# Patient Record
Sex: Female | Born: 1975 | Race: White | Hispanic: No | Marital: Married | Smoking: Current every day smoker
Health system: Southern US, Community
[De-identification: ages and names within clinical notes are randomized; demographics above are authoritative.]

## PROBLEM LIST (undated history)

## (undated) DIAGNOSIS — F3112 Bipolar disorder, current episode manic without psychotic features, moderate: Secondary | ICD-10-CM

## (undated) DIAGNOSIS — IMO0002 Reserved for concepts with insufficient information to code with codable children: Secondary | ICD-10-CM

## (undated) HISTORY — PX: BACK SURGERY: SHX140

## (undated) HISTORY — PX: CHOLECYSTECTOMY: SHX55

---

## 2006-10-29 HISTORY — PX: SPINAL FUSION: SHX223

## 2007-08-10 ENCOUNTER — Emergency Department (HOSPITAL_COMMUNITY): Admission: EM | Admit: 2007-08-10 | Discharge: 2007-08-10 | Payer: Self-pay | Admitting: Emergency Medicine

## 2008-02-11 ENCOUNTER — Emergency Department (HOSPITAL_COMMUNITY): Admission: EM | Admit: 2008-02-11 | Discharge: 2008-02-11 | Payer: Self-pay | Admitting: Emergency Medicine

## 2008-02-15 ENCOUNTER — Emergency Department (HOSPITAL_COMMUNITY): Admission: EM | Admit: 2008-02-15 | Discharge: 2008-02-16 | Payer: Self-pay | Admitting: Emergency Medicine

## 2009-02-02 ENCOUNTER — Emergency Department (HOSPITAL_COMMUNITY): Admission: EM | Admit: 2009-02-02 | Discharge: 2009-02-02 | Payer: Self-pay | Admitting: Emergency Medicine

## 2009-03-18 ENCOUNTER — Emergency Department: Payer: Self-pay | Admitting: Emergency Medicine

## 2009-04-29 ENCOUNTER — Emergency Department: Payer: Self-pay | Admitting: Emergency Medicine

## 2009-05-12 ENCOUNTER — Emergency Department: Payer: Self-pay | Admitting: Emergency Medicine

## 2009-06-23 ENCOUNTER — Emergency Department: Payer: Self-pay | Admitting: Emergency Medicine

## 2009-07-16 ENCOUNTER — Emergency Department: Payer: Self-pay | Admitting: Emergency Medicine

## 2009-07-28 ENCOUNTER — Emergency Department: Payer: Self-pay | Admitting: Internal Medicine

## 2009-09-15 ENCOUNTER — Emergency Department (HOSPITAL_COMMUNITY): Admission: EM | Admit: 2009-09-15 | Discharge: 2009-09-15 | Payer: Self-pay | Admitting: Emergency Medicine

## 2009-09-21 ENCOUNTER — Emergency Department (HOSPITAL_COMMUNITY): Admission: EM | Admit: 2009-09-21 | Discharge: 2009-09-21 | Payer: Self-pay | Admitting: Emergency Medicine

## 2009-10-29 HISTORY — PX: LUMBAR DISC SURGERY: SHX700

## 2010-02-04 ENCOUNTER — Emergency Department (HOSPITAL_COMMUNITY): Admission: EM | Admit: 2010-02-04 | Discharge: 2010-02-04 | Payer: Self-pay | Admitting: Emergency Medicine

## 2010-06-08 ENCOUNTER — Emergency Department (HOSPITAL_COMMUNITY): Admission: EM | Admit: 2010-06-08 | Discharge: 2010-06-08 | Payer: Self-pay | Admitting: Emergency Medicine

## 2010-06-19 ENCOUNTER — Emergency Department (HOSPITAL_COMMUNITY): Admission: EM | Admit: 2010-06-19 | Discharge: 2010-06-19 | Payer: Self-pay | Admitting: Emergency Medicine

## 2010-06-30 ENCOUNTER — Emergency Department (HOSPITAL_COMMUNITY): Admission: EM | Admit: 2010-06-30 | Discharge: 2010-06-30 | Payer: Self-pay | Admitting: Emergency Medicine

## 2010-07-04 ENCOUNTER — Emergency Department (HOSPITAL_COMMUNITY): Admission: EM | Admit: 2010-07-04 | Discharge: 2010-07-04 | Payer: Self-pay | Admitting: Emergency Medicine

## 2010-07-13 ENCOUNTER — Emergency Department (HOSPITAL_COMMUNITY): Admission: EM | Admit: 2010-07-13 | Discharge: 2010-07-13 | Payer: Self-pay | Admitting: Emergency Medicine

## 2010-07-25 ENCOUNTER — Emergency Department (HOSPITAL_COMMUNITY): Admission: EM | Admit: 2010-07-25 | Discharge: 2010-07-25 | Payer: Self-pay | Admitting: Emergency Medicine

## 2010-08-03 ENCOUNTER — Emergency Department (HOSPITAL_COMMUNITY): Admission: EM | Admit: 2010-08-03 | Discharge: 2010-08-03 | Payer: Self-pay | Admitting: Emergency Medicine

## 2010-08-06 ENCOUNTER — Emergency Department (HOSPITAL_COMMUNITY): Admission: EM | Admit: 2010-08-06 | Discharge: 2010-08-06 | Payer: Self-pay | Admitting: Emergency Medicine

## 2010-08-08 ENCOUNTER — Inpatient Hospital Stay (HOSPITAL_COMMUNITY): Admission: RE | Admit: 2010-08-08 | Discharge: 2010-08-11 | Payer: Self-pay | Admitting: Emergency Medicine

## 2010-08-10 ENCOUNTER — Encounter: Payer: Self-pay | Admitting: Neurosurgery

## 2010-10-05 ENCOUNTER — Emergency Department (HOSPITAL_COMMUNITY): Admission: EM | Admit: 2010-10-05 | Discharge: 2010-04-01 | Payer: Self-pay | Admitting: Emergency Medicine

## 2010-12-22 ENCOUNTER — Emergency Department (HOSPITAL_COMMUNITY)
Admission: EM | Admit: 2010-12-22 | Discharge: 2010-12-22 | Disposition: A | Discharge status: Home / Self Care | Payer: Self-pay | Attending: Emergency Medicine | Admitting: Emergency Medicine

## 2010-12-22 DIAGNOSIS — M5430 Sciatica, unspecified side: Secondary | ICD-10-CM | POA: Insufficient documentation

## 2010-12-22 DIAGNOSIS — M545 Low back pain, unspecified: Secondary | ICD-10-CM | POA: Insufficient documentation

## 2010-12-22 DIAGNOSIS — F329 Major depressive disorder, single episode, unspecified: Secondary | ICD-10-CM | POA: Insufficient documentation

## 2010-12-22 DIAGNOSIS — G8929 Other chronic pain: Secondary | ICD-10-CM | POA: Insufficient documentation

## 2010-12-22 DIAGNOSIS — I10 Essential (primary) hypertension: Secondary | ICD-10-CM | POA: Insufficient documentation

## 2010-12-22 DIAGNOSIS — E669 Obesity, unspecified: Secondary | ICD-10-CM | POA: Insufficient documentation

## 2010-12-22 DIAGNOSIS — F3289 Other specified depressive episodes: Secondary | ICD-10-CM | POA: Insufficient documentation

## 2010-12-22 DIAGNOSIS — IMO0002 Reserved for concepts with insufficient information to code with codable children: Secondary | ICD-10-CM | POA: Insufficient documentation

## 2010-12-22 LAB — URINALYSIS, ROUTINE W REFLEX MICROSCOPIC
Nitrite: NEGATIVE
Protein, ur: NEGATIVE mg/dL
Specific Gravity, Urine: 1.024 (ref 1.005–1.030)
Urobilinogen, UA: 0.2 mg/dL (ref 0.0–1.0)

## 2010-12-25 LAB — POCT PREGNANCY, URINE: Preg Test, Ur: NEGATIVE

## 2011-01-11 LAB — DIFFERENTIAL
Basophils Absolute: 0 10*3/uL (ref 0.0–0.1)
Eosinophils Relative: 3 % (ref 0–5)
Lymphocytes Relative: 26 % (ref 12–46)
Lymphocytes Relative: 32 % (ref 12–46)
Lymphs Abs: 2.5 10*3/uL (ref 0.7–4.0)
Neutro Abs: 4.6 10*3/uL (ref 1.7–7.7)
Neutrophils Relative %: 57 % (ref 43–77)
Neutrophils Relative %: 66 % (ref 43–77)

## 2011-01-11 LAB — CBC
MCH: 31.1 pg (ref 26.0–34.0)
MCV: 91.7 fL (ref 78.0–100.0)
MCV: 91.9 fL (ref 78.0–100.0)
MCV: 93.5 fL (ref 78.0–100.0)
Platelets: 181 10*3/uL (ref 150–400)
Platelets: 185 10*3/uL (ref 150–400)
Platelets: 226 10*3/uL (ref 150–400)
RBC: 3.82 MIL/uL — ABNORMAL LOW (ref 3.87–5.11)
RBC: 4.38 MIL/uL (ref 3.87–5.11)
RDW: 13.8 % (ref 11.5–15.5)
RDW: 14.8 % (ref 11.5–15.5)
WBC: 8.1 10*3/uL (ref 4.0–10.5)
WBC: 9.7 10*3/uL (ref 4.0–10.5)

## 2011-01-11 LAB — URINALYSIS, ROUTINE W REFLEX MICROSCOPIC
Glucose, UA: NEGATIVE mg/dL
pH: 6.5 (ref 5.0–8.0)

## 2011-01-11 LAB — BASIC METABOLIC PANEL
BUN: 3 mg/dL — ABNORMAL LOW (ref 6–23)
Calcium: 8.3 mg/dL — ABNORMAL LOW (ref 8.4–10.5)
Calcium: 8.8 mg/dL (ref 8.4–10.5)
Chloride: 105 mEq/L (ref 96–112)
Creatinine, Ser: 0.74 mg/dL (ref 0.4–1.2)
Creatinine, Ser: 0.76 mg/dL (ref 0.4–1.2)
GFR calc Af Amer: >60 mL/min (ref >60)
GFR calc Af Amer: >60 mL/min (ref >60)
GFR calc non Af Amer: >60 mL/min (ref >60)
Sodium: 137 mEq/L (ref 135–145)

## 2011-01-11 LAB — COMPREHENSIVE METABOLIC PANEL
Albumin: 3.5 g/dL (ref 3.5–5.2)
BUN: 6 mg/dL (ref 6–23)
Chloride: 107 mEq/L (ref 96–112)
Creatinine, Ser: 0.68 mg/dL (ref 0.4–1.2)
GFR calc non Af Amer: >60 mL/min (ref >60)
Total Bilirubin: 0.7 mg/dL (ref 0.3–1.2)

## 2011-01-11 LAB — URINE CULTURE
Colony Count: 55000
Culture  Setup Time: 201110120537

## 2011-01-11 LAB — APTT: aPTT: 31 seconds (ref 24–37)

## 2011-01-11 LAB — PROTIME-INR
INR: 0.98 (ref 0.00–1.49)
INR: 1.15 (ref 0.00–1.49)
Prothrombin Time: 13.2 seconds (ref 11.6–15.2)
Prothrombin Time: 14.9 seconds (ref 11.6–15.2)

## 2011-01-11 LAB — URINE MICROSCOPIC-ADD ON

## 2011-01-11 LAB — RAPID URINE DRUG SCREEN, HOSP PERFORMED: Tetrahydrocannabinol: POSITIVE — AB

## 2011-01-17 LAB — POCT I-STAT, CHEM 8
Chloride: 106 mEq/L (ref 96–112)
Creatinine, Ser: 0.7 mg/dL (ref 0.4–1.2)
Glucose, Bld: 87 mg/dL (ref 70–99)
Potassium: 4 mEq/L (ref 3.5–5.1)

## 2011-01-17 LAB — URINALYSIS, ROUTINE W REFLEX MICROSCOPIC
Bilirubin Urine: NEGATIVE
Hgb urine dipstick: NEGATIVE
Ketones, ur: NEGATIVE mg/dL
Protein, ur: NEGATIVE mg/dL
Urobilinogen, UA: 0.2 mg/dL (ref 0.0–1.0)

## 2011-01-31 LAB — POCT PREGNANCY, URINE: Preg Test, Ur: NEGATIVE

## 2011-01-31 LAB — URINALYSIS, ROUTINE W REFLEX MICROSCOPIC
Bilirubin Urine: NEGATIVE
Hgb urine dipstick: NEGATIVE
Nitrite: NEGATIVE
Nitrite: NEGATIVE
Protein, ur: 100 mg/dL — AB
Specific Gravity, Urine: 1.027 (ref 1.005–1.030)
Specific Gravity, Urine: >1.03 — ABNORMAL HIGH (ref 1.005–1.030)
Urobilinogen, UA: 0.2 mg/dL (ref 0.0–1.0)
pH: 6 (ref 5.0–8.0)

## 2011-01-31 LAB — POCT I-STAT, CHEM 8
BUN: 10 mg/dL (ref 6–23)
Calcium, Ion: 1.19 mmol/L (ref 1.12–1.32)
Creatinine, Ser: 0.7 mg/dL (ref 0.4–1.2)
Glucose, Bld: 83 mg/dL (ref 70–99)
Hemoglobin: 12.9 g/dL (ref 12.0–15.0)
Sodium: 143 mEq/L (ref 135–145)
TCO2: 26 mmol/L (ref 0–100)

## 2011-01-31 LAB — URINE MICROSCOPIC-ADD ON

## 2011-01-31 LAB — RAPID URINE DRUG SCREEN, HOSP PERFORMED
Amphetamines: NOT DETECTED
Benzodiazepines: NOT DETECTED
Cocaine: NOT DETECTED

## 2011-01-31 LAB — CBC
HCT: 36.5 % (ref 36.0–46.0)
Hemoglobin: 12.6 g/dL (ref 12.0–15.0)
MCV: 93 fL (ref 78.0–100.0)
Platelets: 174 10*3/uL (ref 150–400)
WBC: 6.8 10*3/uL (ref 4.0–10.5)

## 2011-01-31 LAB — WET PREP, GENITAL
Clue Cells Wet Prep HPF POC: NONE SEEN
Trich, Wet Prep: NONE SEEN

## 2011-01-31 LAB — DIFFERENTIAL
Eosinophils Absolute: 0.2 10*3/uL (ref 0.0–0.7)
Eosinophils Relative: 3 % (ref 0–5)
Lymphocytes Relative: 33 % (ref 12–46)
Lymphs Abs: 2.3 10*3/uL (ref 0.7–4.0)
Monocytes Absolute: 0.4 10*3/uL (ref 0.1–1.0)
Monocytes Relative: 6 % (ref 3–12)

## 2011-01-31 LAB — URINE CULTURE

## 2011-02-13 ENCOUNTER — Emergency Department (HOSPITAL_COMMUNITY)
Admission: EM | Admit: 2011-02-13 | Discharge: 2011-02-14 | Disposition: A | Discharge status: Home / Self Care | Payer: Self-pay | Attending: Emergency Medicine | Admitting: Emergency Medicine

## 2011-02-13 DIAGNOSIS — R112 Nausea with vomiting, unspecified: Secondary | ICD-10-CM | POA: Insufficient documentation

## 2011-02-13 DIAGNOSIS — A499 Bacterial infection, unspecified: Secondary | ICD-10-CM | POA: Insufficient documentation

## 2011-02-13 DIAGNOSIS — N949 Unspecified condition associated with female genital organs and menstrual cycle: Secondary | ICD-10-CM | POA: Insufficient documentation

## 2011-02-13 DIAGNOSIS — B9689 Other specified bacterial agents as the cause of diseases classified elsewhere: Secondary | ICD-10-CM | POA: Insufficient documentation

## 2011-02-13 DIAGNOSIS — E860 Dehydration: Secondary | ICD-10-CM | POA: Insufficient documentation

## 2011-02-13 DIAGNOSIS — R197 Diarrhea, unspecified: Secondary | ICD-10-CM | POA: Insufficient documentation

## 2011-02-13 DIAGNOSIS — F329 Major depressive disorder, single episode, unspecified: Secondary | ICD-10-CM | POA: Insufficient documentation

## 2011-02-13 DIAGNOSIS — F3289 Other specified depressive episodes: Secondary | ICD-10-CM | POA: Insufficient documentation

## 2011-02-13 DIAGNOSIS — G8929 Other chronic pain: Secondary | ICD-10-CM | POA: Insufficient documentation

## 2011-02-13 DIAGNOSIS — M549 Dorsalgia, unspecified: Secondary | ICD-10-CM | POA: Insufficient documentation

## 2011-02-13 DIAGNOSIS — N760 Acute vaginitis: Secondary | ICD-10-CM | POA: Insufficient documentation

## 2011-02-13 DIAGNOSIS — R1031 Right lower quadrant pain: Secondary | ICD-10-CM | POA: Insufficient documentation

## 2011-02-14 ENCOUNTER — Emergency Department (HOSPITAL_COMMUNITY): Payer: Self-pay

## 2011-02-14 LAB — WET PREP, GENITAL: Trich, Wet Prep: NONE SEEN

## 2011-02-14 LAB — URINALYSIS, ROUTINE W REFLEX MICROSCOPIC
Hgb urine dipstick: NEGATIVE
Ketones, ur: NEGATIVE mg/dL
Nitrite: NEGATIVE
Specific Gravity, Urine: >1.03 — ABNORMAL HIGH (ref 1.005–1.030)
pH: 5.5 (ref 5.0–8.0)

## 2011-02-14 LAB — DIFFERENTIAL
Basophils Absolute: 0 10*3/uL (ref 0.0–0.1)
Basophils Relative: 0 % (ref 0–1)
Lymphocytes Relative: 36 % (ref 12–46)
Neutro Abs: 4.6 10*3/uL (ref 1.7–7.7)
Neutrophils Relative %: 55 % (ref 43–77)

## 2011-02-14 LAB — HEPATIC FUNCTION PANEL
AST: 16 U/L (ref 0–37)
Albumin: 3.3 g/dL — ABNORMAL LOW (ref 3.5–5.2)
Alkaline Phosphatase: 66 U/L (ref 39–117)
Total Bilirubin: 0.7 mg/dL (ref 0.3–1.2)

## 2011-02-14 LAB — CBC
HCT: 35.8 % — ABNORMAL LOW (ref 36.0–46.0)
Hemoglobin: 12 g/dL (ref 12.0–15.0)
RBC: 4.06 MIL/uL (ref 3.87–5.11)
WBC: 8.5 10*3/uL (ref 4.0–10.5)

## 2011-02-14 LAB — BASIC METABOLIC PANEL
CO2: 25 mEq/L (ref 19–32)
Calcium: 8.4 mg/dL (ref 8.4–10.5)
Chloride: 109 mEq/L (ref 96–112)
GFR calc Af Amer: >60 mL/min (ref >60)
Sodium: 139 mEq/L (ref 135–145)

## 2011-02-14 LAB — PREGNANCY, URINE: Preg Test, Ur: NEGATIVE

## 2011-02-14 MED ORDER — IOHEXOL 300 MG/ML  SOLN: 100.0000 mL | Freq: Once | INTRAMUSCULAR | Status: DC | PRN

## 2011-02-15 LAB — GC/CHLAMYDIA PROBE AMP, GENITAL: GC Probe Amp, Genital: POSITIVE — AB

## 2011-03-07 ENCOUNTER — Emergency Department (HOSPITAL_COMMUNITY)
Admission: EM | Admit: 2011-03-07 | Discharge: 2011-03-07 | Disposition: A | Discharge status: Home / Self Care | Payer: Self-pay | Attending: Emergency Medicine | Admitting: Emergency Medicine

## 2011-03-07 DIAGNOSIS — M549 Dorsalgia, unspecified: Secondary | ICD-10-CM | POA: Insufficient documentation

## 2011-03-25 ENCOUNTER — Observation Stay (HOSPITAL_COMMUNITY)
Admission: EM | Admit: 2011-03-25 | Discharge: 2011-03-25 | Disposition: A | Discharge status: Home / Self Care | Payer: Self-pay | Attending: Emergency Medicine | Admitting: Emergency Medicine

## 2011-03-25 DIAGNOSIS — M79609 Pain in unspecified limb: Secondary | ICD-10-CM | POA: Insufficient documentation

## 2011-03-25 DIAGNOSIS — M545 Low back pain, unspecified: Principal | ICD-10-CM | POA: Insufficient documentation

## 2011-03-25 DIAGNOSIS — G8929 Other chronic pain: Secondary | ICD-10-CM | POA: Insufficient documentation

## 2011-03-25 DIAGNOSIS — N390 Urinary tract infection, site not specified: Secondary | ICD-10-CM | POA: Insufficient documentation

## 2011-03-25 LAB — URINALYSIS, ROUTINE W REFLEX MICROSCOPIC
Glucose, UA: NEGATIVE mg/dL
Nitrite: NEGATIVE
Protein, ur: NEGATIVE mg/dL
Urobilinogen, UA: 0.2 mg/dL (ref 0.0–1.0)

## 2011-03-25 LAB — URINE MICROSCOPIC-ADD ON

## 2011-03-27 LAB — URINE CULTURE: Colony Count: >=100000

## 2011-05-02 ENCOUNTER — Emergency Department (HOSPITAL_COMMUNITY): Payer: Self-pay

## 2011-05-02 ENCOUNTER — Emergency Department (HOSPITAL_COMMUNITY)
Admission: EM | Admit: 2011-05-02 | Discharge: 2011-05-02 | Disposition: A | Discharge status: Home / Self Care | Payer: Self-pay | Attending: Emergency Medicine | Admitting: Emergency Medicine

## 2011-05-02 DIAGNOSIS — M25579 Pain in unspecified ankle and joints of unspecified foot: Secondary | ICD-10-CM | POA: Insufficient documentation

## 2011-05-02 DIAGNOSIS — Y9301 Activity, walking, marching and hiking: Secondary | ICD-10-CM | POA: Insufficient documentation

## 2011-05-02 DIAGNOSIS — S7000XA Contusion of unspecified hip, initial encounter: Secondary | ICD-10-CM | POA: Insufficient documentation

## 2011-05-02 DIAGNOSIS — Y92410 Unspecified street and highway as the place of occurrence of the external cause: Secondary | ICD-10-CM | POA: Insufficient documentation

## 2011-05-02 DIAGNOSIS — W010XXA Fall on same level from slipping, tripping and stumbling without subsequent striking against object, initial encounter: Secondary | ICD-10-CM | POA: Insufficient documentation

## 2011-05-02 DIAGNOSIS — M25559 Pain in unspecified hip: Secondary | ICD-10-CM | POA: Insufficient documentation

## 2011-05-16 ENCOUNTER — Emergency Department (HOSPITAL_COMMUNITY)
Admission: EM | Admit: 2011-05-16 | Discharge: 2011-05-17 | Disposition: A | Discharge status: Home / Self Care | Payer: Self-pay | Attending: Emergency Medicine | Admitting: Emergency Medicine

## 2011-05-16 DIAGNOSIS — M545 Low back pain, unspecified: Secondary | ICD-10-CM | POA: Insufficient documentation

## 2011-05-16 DIAGNOSIS — G8929 Other chronic pain: Secondary | ICD-10-CM | POA: Insufficient documentation

## 2011-05-16 DIAGNOSIS — F329 Major depressive disorder, single episode, unspecified: Secondary | ICD-10-CM | POA: Insufficient documentation

## 2011-05-16 DIAGNOSIS — F3289 Other specified depressive episodes: Secondary | ICD-10-CM | POA: Insufficient documentation

## 2011-05-25 IMAGING — CR DG LUMBAR SPINE COMPLETE 4+V
5 series · 5 of 5 positions shown · non-contrast
Comparison: None.

CLINICAL DATA: 34-year-old female with right hip pain status post
fall.

LUMBAR SPINE - COMPLETE 4+ VIEW

[t l-spine a.p.]
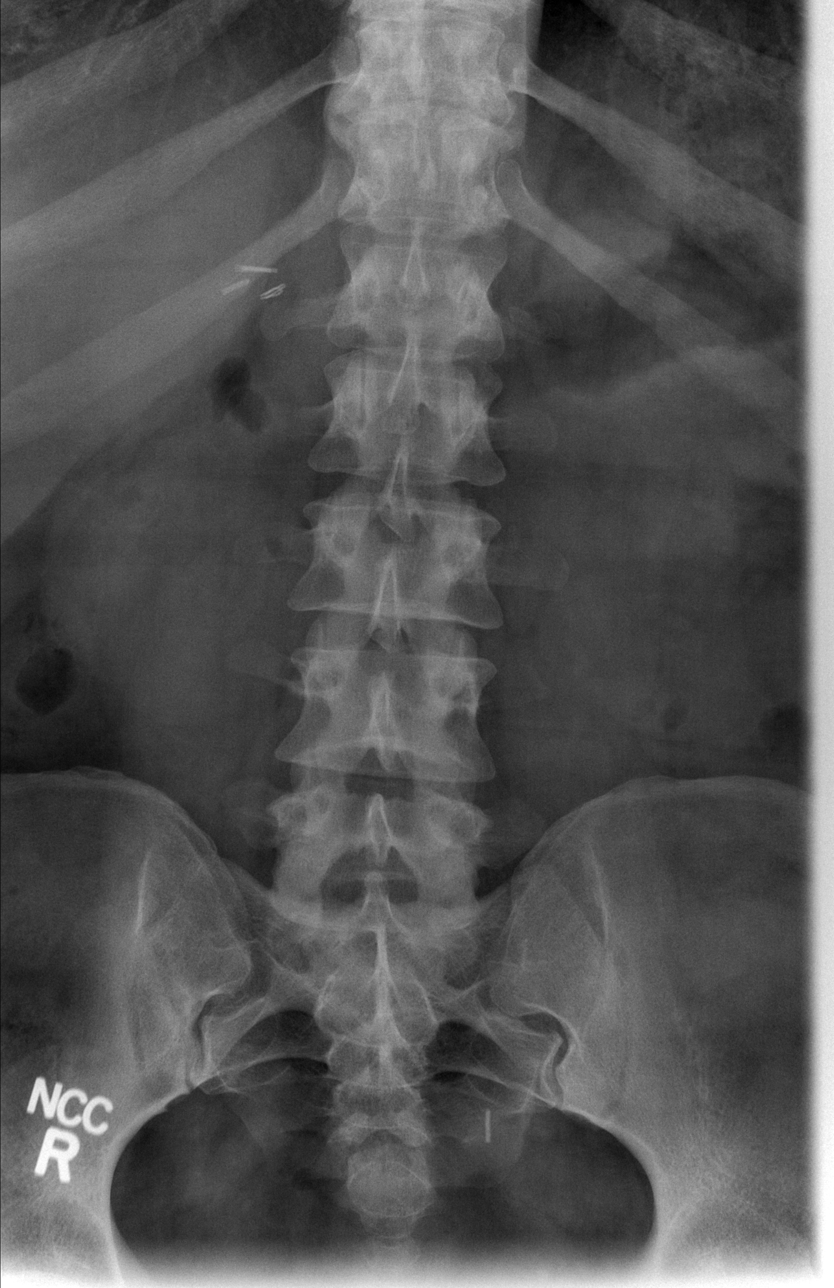

[t l-spine oblique exposure (1 of 2)]
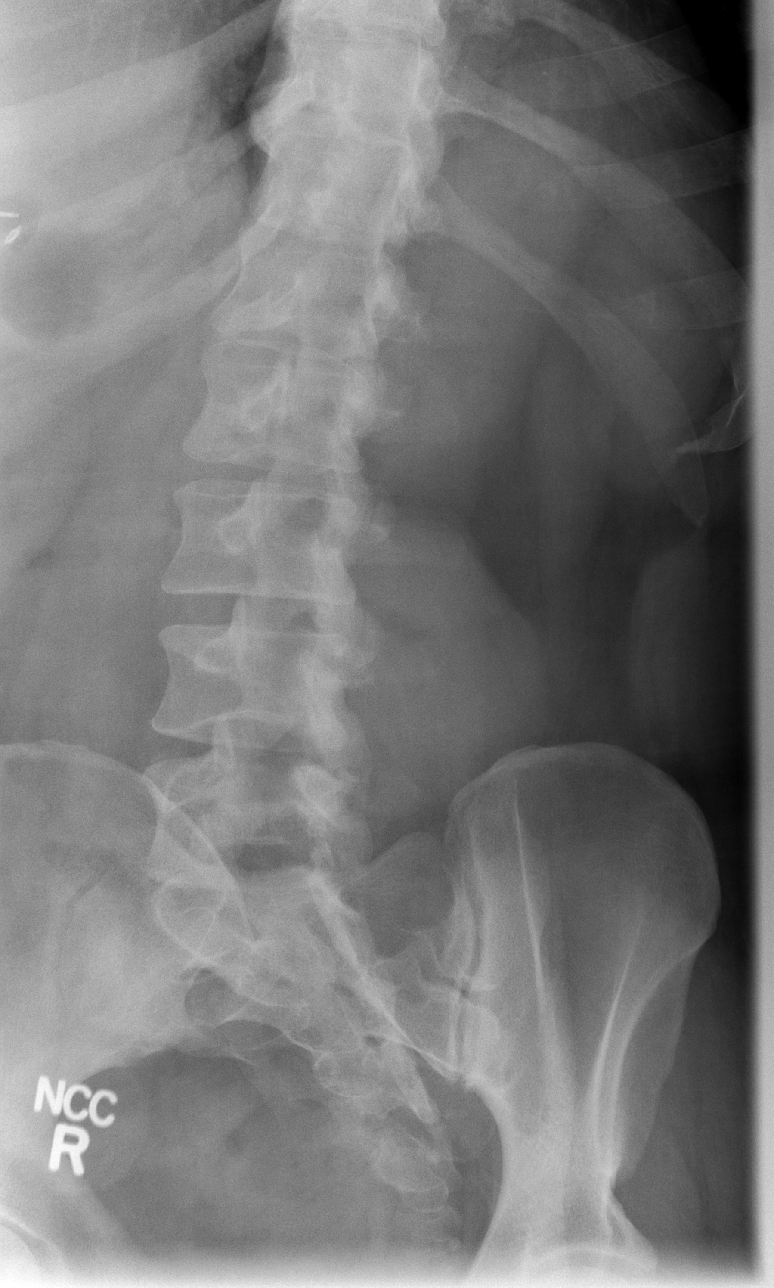

[t l-spine oblique exposure (2 of 2)]
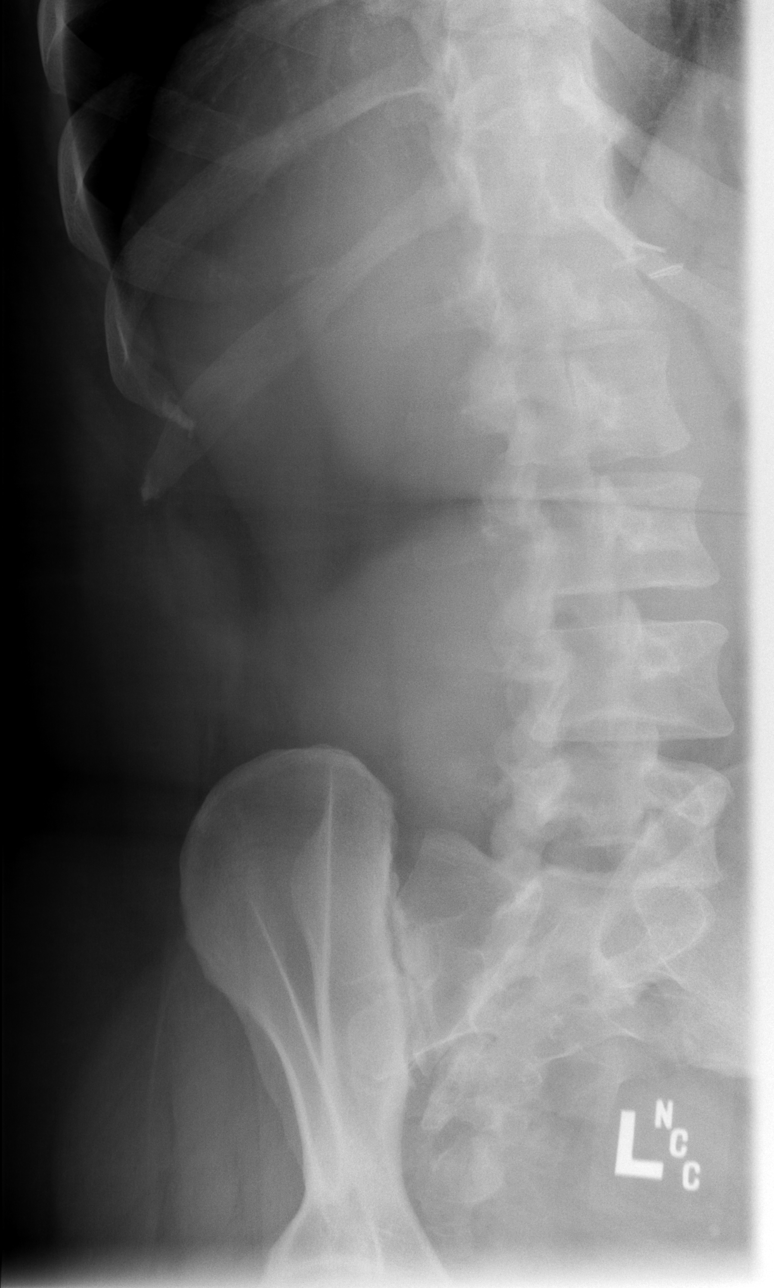

[t l-spine lat]
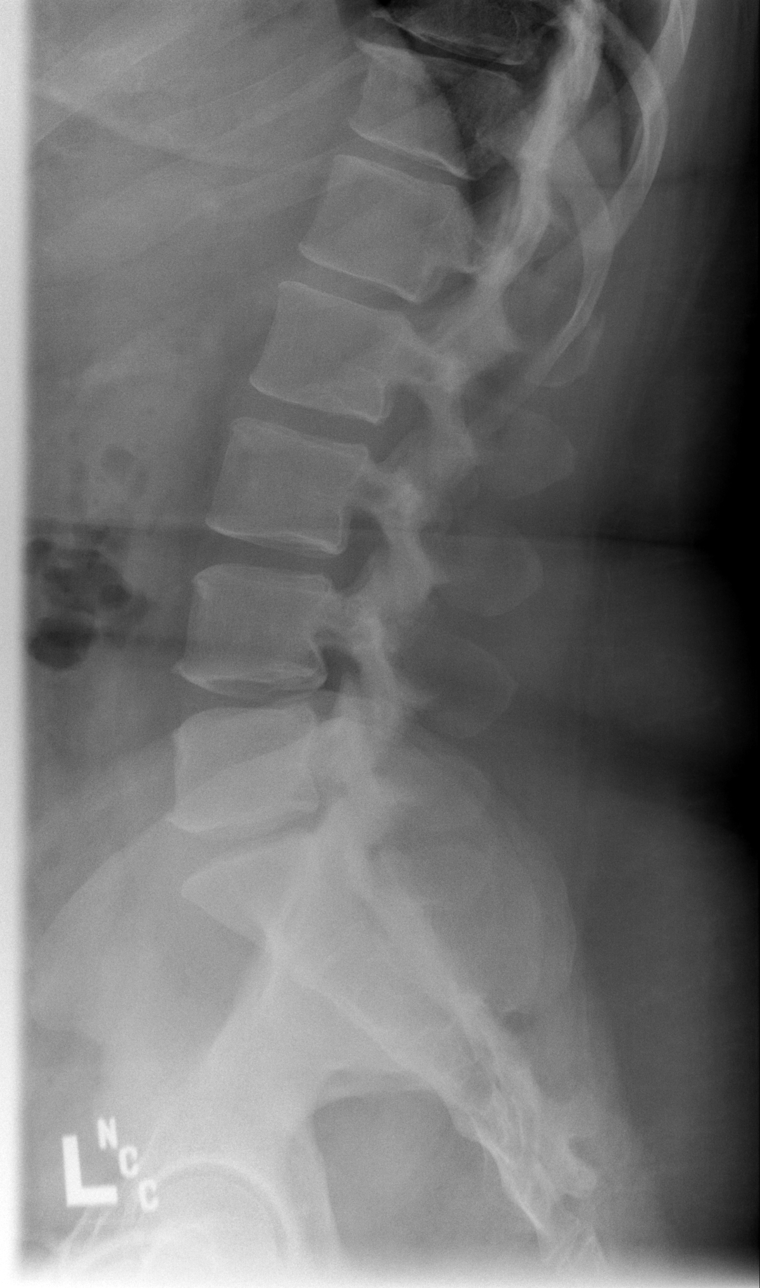

[t l-spine l5-s1 spot]
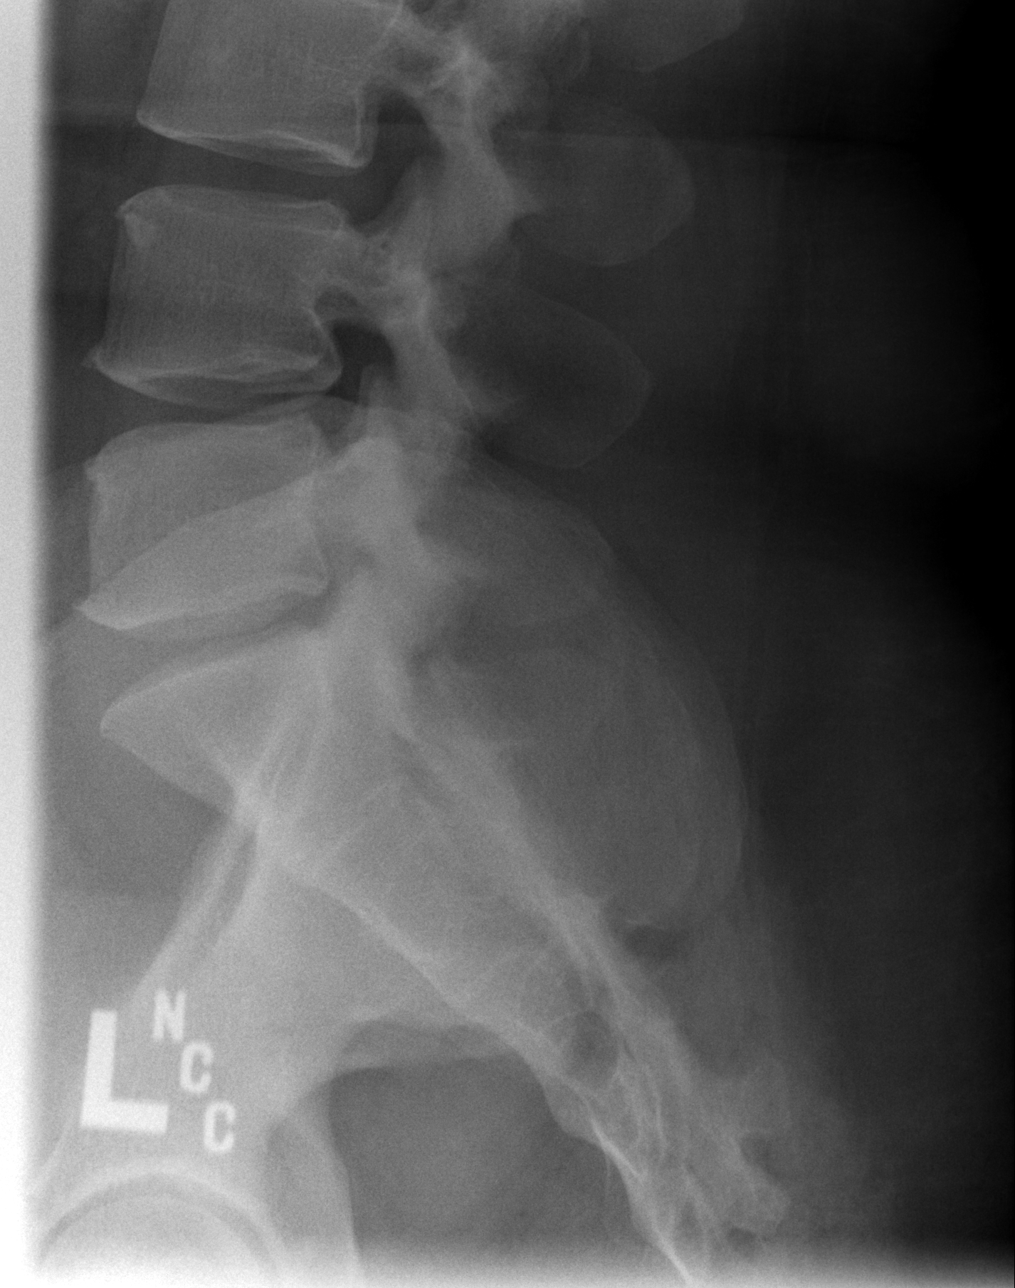

[5 of 5 positions shown; findings below may reference images not displayed]

FINDINGS: Right upper quadrant surgical clips.  Normal lumbar
segmentation. Bone mineralization is within normal limits. Normal
vertebral body height alignment.  Mild L4-L5 and L5-L1 disc space
narrowing with early endplate spurring.  Anterior endplate spurring
in the lower thoracic spine.  Probable congenital ununited left L1
transverse process ossification center.  Sacrum and SI joints are
within normal limits.  Left hemi pelvis surgical clip.  No pars
fracture.
IMPRESSION: No acute fracture or listhesis identified in the lumbar spine.

## 2011-05-25 IMAGING — CR DG PELVIS 1-2V
1 series · 1 of 1 positions shown · non-contrast
Comparison: None

CLINICAL DATA: Right hip pain.

PELVIS - 1-2 VIEW

[t pelvis a.p.]
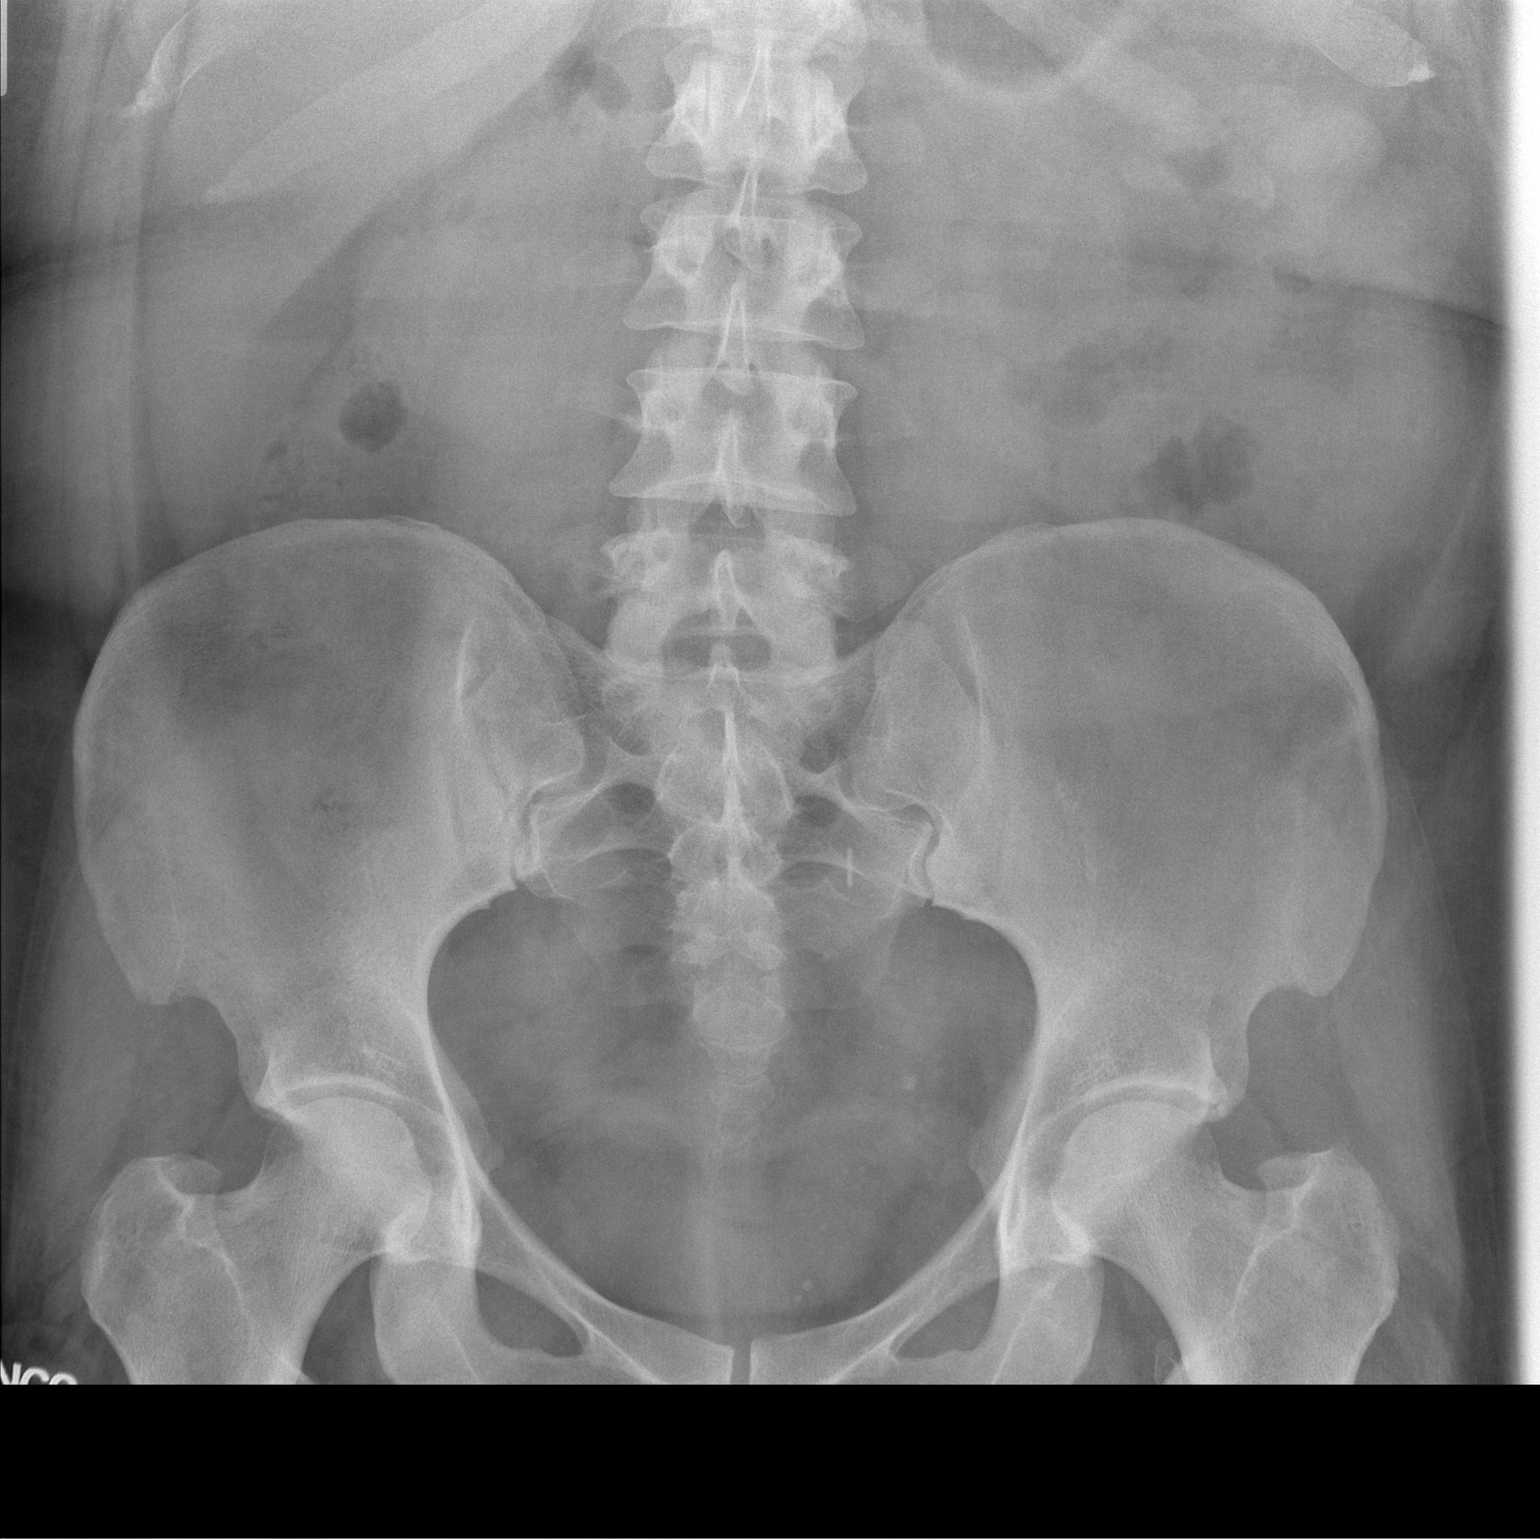

[1 of 1 positions shown; findings below may reference images not displayed]

FINDINGS: No acute bony abnormality.  Specifically, no fracture,
subluxation, or dislocation.  Soft tissues are intact. SI joints
are unremarkable.
IMPRESSION: No acute bony abnormality.

## 2011-07-24 LAB — RAPID URINE DRUG SCREEN, HOSP PERFORMED
Barbiturates: NOT DETECTED
Opiates: NOT DETECTED

## 2011-07-24 LAB — POCT I-STAT, CHEM 8
HCT: 37
Hemoglobin: 12.6
Potassium: 3.8
Sodium: 143
TCO2: 27

## 2011-07-24 LAB — ACETAMINOPHEN LEVEL: Acetaminophen (Tylenol), Serum: <10 — ABNORMAL LOW

## 2011-07-24 LAB — ETHANOL: Alcohol, Ethyl (B): <5

## 2011-07-24 LAB — SALICYLATE LEVEL: Salicylate Lvl: <4

## 2011-08-09 LAB — URINALYSIS, ROUTINE W REFLEX MICROSCOPIC
Nitrite: NEGATIVE
Protein, ur: 30 — AB
Specific Gravity, Urine: 1.035 — ABNORMAL HIGH
Urobilinogen, UA: 1

## 2011-08-09 LAB — URINE MICROSCOPIC-ADD ON

## 2011-08-16 ENCOUNTER — Emergency Department (HOSPITAL_COMMUNITY)
Admission: EM | Admit: 2011-08-16 | Discharge: 2011-08-16 | Disposition: A | Discharge status: Home / Self Care | Payer: Self-pay | Attending: Emergency Medicine | Admitting: Emergency Medicine

## 2011-08-16 ENCOUNTER — Emergency Department (HOSPITAL_COMMUNITY): Payer: Self-pay

## 2011-08-16 DIAGNOSIS — G8929 Other chronic pain: Secondary | ICD-10-CM | POA: Insufficient documentation

## 2011-08-16 DIAGNOSIS — F329 Major depressive disorder, single episode, unspecified: Secondary | ICD-10-CM | POA: Insufficient documentation

## 2011-08-16 DIAGNOSIS — M545 Low back pain, unspecified: Secondary | ICD-10-CM | POA: Insufficient documentation

## 2011-08-16 DIAGNOSIS — F3289 Other specified depressive episodes: Secondary | ICD-10-CM | POA: Insufficient documentation

## 2011-08-16 DIAGNOSIS — R209 Unspecified disturbances of skin sensation: Secondary | ICD-10-CM | POA: Insufficient documentation

## 2011-08-16 LAB — CBC
HCT: 39.5 % (ref 36.0–46.0)
Hemoglobin: 13.3 g/dL (ref 12.0–15.0)
MCHC: 33.7 g/dL (ref 30.0–36.0)
MCV: 90.8 fL (ref 78.0–100.0)
RDW: 14.2 % (ref 11.5–15.5)
WBC: 9.5 10*3/uL (ref 4.0–10.5)

## 2011-08-16 LAB — BASIC METABOLIC PANEL
BUN: 7 mg/dL (ref 6–23)
Chloride: 105 mEq/L (ref 96–112)
Creatinine, Ser: 0.7 mg/dL (ref 0.50–1.10)
GFR calc Af Amer: >90 mL/min (ref >90)
Glucose, Bld: 83 mg/dL (ref 70–99)
Potassium: 3.7 mEq/L (ref 3.5–5.1)

## 2012-01-30 ENCOUNTER — Emergency Department (HOSPITAL_COMMUNITY): Payer: Self-pay

## 2012-01-30 ENCOUNTER — Encounter (HOSPITAL_COMMUNITY): Payer: Self-pay

## 2012-01-30 ENCOUNTER — Emergency Department (HOSPITAL_COMMUNITY)
Admission: EM | Admit: 2012-01-30 | Discharge: 2012-01-30 | Disposition: A | Discharge status: Home / Self Care | Payer: Self-pay | Attending: Emergency Medicine | Admitting: Emergency Medicine

## 2012-01-30 DIAGNOSIS — J029 Acute pharyngitis, unspecified: Secondary | ICD-10-CM | POA: Insufficient documentation

## 2012-01-30 DIAGNOSIS — J329 Chronic sinusitis, unspecified: Secondary | ICD-10-CM | POA: Insufficient documentation

## 2012-01-30 DIAGNOSIS — R0602 Shortness of breath: Secondary | ICD-10-CM | POA: Insufficient documentation

## 2012-01-30 DIAGNOSIS — R1110 Vomiting, unspecified: Secondary | ICD-10-CM | POA: Insufficient documentation

## 2012-01-30 DIAGNOSIS — R059 Cough, unspecified: Secondary | ICD-10-CM | POA: Insufficient documentation

## 2012-01-30 DIAGNOSIS — H6690 Otitis media, unspecified, unspecified ear: Secondary | ICD-10-CM | POA: Insufficient documentation

## 2012-01-30 DIAGNOSIS — R079 Chest pain, unspecified: Secondary | ICD-10-CM | POA: Insufficient documentation

## 2012-01-30 DIAGNOSIS — R05 Cough: Secondary | ICD-10-CM | POA: Insufficient documentation

## 2012-01-30 DIAGNOSIS — J3489 Other specified disorders of nose and nasal sinuses: Secondary | ICD-10-CM | POA: Insufficient documentation

## 2012-01-30 DIAGNOSIS — H9209 Otalgia, unspecified ear: Secondary | ICD-10-CM | POA: Insufficient documentation

## 2012-01-30 DIAGNOSIS — R0982 Postnasal drip: Secondary | ICD-10-CM | POA: Insufficient documentation

## 2012-01-30 MED ORDER — PHENYLEPH-PROMETHAZINE-COD 5-6.25-10 MG/5ML PO SYRP: 5.0000 mL | ORAL_SOLUTION | Freq: Four times a day (QID) | ORAL | Status: AC | PRN

## 2012-01-30 MED ORDER — AMOXICILLIN-POT CLAVULANATE 875-125 MG PO TABS
1.0000 | ORAL_TABLET | Freq: Once | ORAL | Status: AC
  Administered 2012-01-30: 1 via ORAL
  Filled 2012-01-30: qty 1

## 2012-01-30 MED ORDER — HYDROCOD POLST-CHLORPHEN POLST 10-8 MG/5ML PO LQCR
5.0000 mL | Freq: Once | ORAL | Status: AC
  Administered 2012-01-30: 5 mL via ORAL
  Filled 2012-01-30: qty 5

## 2012-01-30 MED ORDER — AMOXICILLIN-POT CLAVULANATE 875-125 MG PO TABS: 1.0000 | ORAL_TABLET | Freq: Two times a day (BID) | ORAL | Status: AC

## 2012-01-30 MED ORDER — ALBUTEROL SULFATE HFA 108 (90 BASE) MCG/ACT IN AERS
2.0000 | INHALATION_SPRAY | Freq: Once | RESPIRATORY_TRACT | Status: AC
  Administered 2012-01-30: 2 via RESPIRATORY_TRACT
  Filled 2012-01-30: qty 6.7

## 2012-01-30 MED ORDER — ONDANSETRON 4 MG PO TBDP
4.0000 mg | ORAL_TABLET | Freq: Once | ORAL | Status: AC
  Administered 2012-01-30: 4 mg via ORAL
  Filled 2012-01-30: qty 1

## 2012-01-30 NOTE — ED Notes (Signed)
Pt complaining of nausea, green emesis bag given to pt. Pt has not vomited yet. Pt continues to cough excessively.

## 2012-01-30 NOTE — ED Notes (Signed)
Pt states that she's been sick for about two weeks, tonight the coughing became worse and her body is very tired

## 2012-01-30 NOTE — ED Provider Notes (Signed)
History     CSN: [id number]  Arrival date & time 01/30/12  0053   First MD Initiated Contact with Patient 01/30/12 0250      Chief Complaint  Patient presents with  . Cough  . Shortness of Breath    (Consider location/radiation/quality/duration/timing/severity/associated sxs/prior treatment) HPI Comments: Issues except for 3, weeks she's had URI symptoms, that it kind of waxed and waned for the past 3, days.  She's had a nonproductive cough, left ear pain, and feeling like her left sinuses were plugged.  She does have a postnasal drip admitting over-the-counter medication without relief.  Now she is coughing to the point where she is having post tussive emesis  Patient is a 36 y.o. female presenting with cough and shortness of breath. The history is provided by the patient.  Cough The current episode started more than 1 week ago. The problem occurs every few minutes. The problem has been gradually worsening. The cough is non-productive. There has been no fever. Associated symptoms include chest pain, ear pain, rhinorrhea, sore throat and shortness of breath. Pertinent negatives include no wheezing.  Shortness of Breath  Associated symptoms include chest pain, rhinorrhea, sore throat, cough and shortness of breath. Pertinent negatives include no fever and no wheezing.    History reviewed. No pertinent past medical history.  History reviewed. No pertinent past surgical history.  History reviewed. No pertinent family history.  History  Substance Use Topics  . Smoking status: Not on file  . Smokeless tobacco: Not on file  . Alcohol Use: Yes    OB History    Grav Para Term Preterm Abortions TAB SAB Ect Mult Living                  Review of Systems  Constitutional: Negative for fever.  HENT: Positive for ear pain, sore throat, rhinorrhea and sinus pressure.   Respiratory: Positive for cough and shortness of breath. Negative for wheezing.   Cardiovascular: Positive for chest  pain.  Gastrointestinal: Positive for vomiting.    Allergies  Review of patient's allergies indicates no known allergies.  Home Medications   Current Outpatient Rx  Name Route Sig Dispense Refill  . AMOXICILLIN-POT CLAVULANATE 875-125 MG PO TABS Oral Take 1 tablet by mouth 2 (two) times daily. 20 tablet 0  . PHENYLEPH-PROMETHAZINE-COD 5-6.25-10 MG/5ML PO SYRP Oral Take 5 mLs by mouth 4 (four) times daily as needed. 180 mL 0    BP 129/66  Pulse 94  Temp(Src) 98.6 F (37 C) (Oral)  Resp 24  SpO2 99%  LMP 01/29/2012  Physical Exam  Constitutional: She is oriented to person, place, and time. She appears well-developed and well-nourished.  HENT:  Head: Normocephalic.  Left Ear: Hearing normal. Tympanic membrane is erythematous and bulging.  Nose: Left sinus exhibits maxillary sinus tenderness and frontal sinus tenderness.  Eyes: Pupils are equal, round, and reactive to light.  Neck: Normal range of motion.  Cardiovascular: Normal rate.   Pulmonary/Chest: Effort normal. No respiratory distress. She has no wheezes.       Harsh cough  Abdominal: Soft.  Musculoskeletal: Normal range of motion.  Neurological: She is alert and oriented to person, place, and time.  Skin: Skin is warm and dry. There is pallor.    ED Course  Procedures (including critical care time)  Labs Reviewed - No data to display Dg Chest 2 View  01/30/2012  *RADIOLOGY REPORT*  Clinical Data: Shortness of breath and wheezing.  Fever and cough.  CHEST -  2 VIEW  Comparison: 08/09/2010  Findings: The heart size and pulmonary vascularity are normal. The lungs appear clear and expanded without focal air space disease or consolidation. No blunting of the costophrenic angles. Degenerative changes in the thoracic spine.  No significant change since previous study.  No pneumothorax.  IMPRESSION: No evidence of active pulmonary disease.  Original Report Authenticated By: Marlon Pel, M.D.     1. Sinusitis   2.  Otitis media   3. Cough       MDM  Will obtain chest x-ray to rule out pneumonia.  She definitely has an otitis media on the left, as well as a tender left frontal and maxillary sinus        Arman Filter, NP 01/30/12 [phone]

## 2012-01-30 NOTE — Discharge Instructions (Signed)
Take all of the antibiotic until completed U. been given a prescription for Phenergan with codeine.  She uses for your cough.  Every 4-6 hours as needed.  This should prevent nausea and vomiting after coughing.  Will also help with her sinus congestion

## 2012-01-30 NOTE — ED Notes (Signed)
Pt reports having cough, fever, and body aches for 2 weeks. States that she has taken OTC meds with no relief. Pt is an active smoker. Pt's bilateral lung sounds CTA, O2 sats 100% on room air. Pt complaining of head pain from coughing so much.

## 2012-01-30 NOTE — ED Notes (Signed)
Patient transported to X-ray 

## 2012-01-30 NOTE — ED Provider Notes (Signed)
Medical screening examination/treatment/procedure(s) were performed by non-physician practitioner and as supervising physician I was immediately available for consultation/collaboration.  Idara Woodside M Cleavon Goldman, MD 01/30/12 0700 

## 2012-02-16 ENCOUNTER — Emergency Department (HOSPITAL_COMMUNITY)
Admission: EM | Admit: 2012-02-16 | Discharge: 2012-02-16 | Disposition: A | Discharge status: Home / Self Care | Payer: Self-pay | Attending: Emergency Medicine | Admitting: Emergency Medicine

## 2012-02-16 ENCOUNTER — Encounter (HOSPITAL_COMMUNITY): Payer: Self-pay | Admitting: *Deleted

## 2012-02-16 DIAGNOSIS — F3112 Bipolar disorder, current episode manic without psychotic features, moderate: Secondary | ICD-10-CM | POA: Insufficient documentation

## 2012-02-16 DIAGNOSIS — M545 Low back pain: Secondary | ICD-10-CM

## 2012-02-16 DIAGNOSIS — G8929 Other chronic pain: Secondary | ICD-10-CM | POA: Insufficient documentation

## 2012-02-16 DIAGNOSIS — M549 Dorsalgia, unspecified: Secondary | ICD-10-CM | POA: Insufficient documentation

## 2012-02-16 HISTORY — DX: Reserved for concepts with insufficient information to code with codable children: IMO0002

## 2012-02-16 HISTORY — DX: Bipolar disorder, current episode manic without psychotic features, moderate: F31.12

## 2012-02-16 MED ORDER — HYDROMORPHONE HCL PF 1 MG/ML IJ SOLN
1.0000 mg | Freq: Once | INTRAMUSCULAR | Status: AC
  Administered 2012-02-16: 1 mg via INTRAMUSCULAR
  Filled 2012-02-16: qty 1

## 2012-02-16 MED ORDER — KETOROLAC TROMETHAMINE 60 MG/2ML IM SOLN
60.0000 mg | Freq: Once | INTRAMUSCULAR | Status: AC
  Administered 2012-02-16: 60 mg via INTRAMUSCULAR
  Filled 2012-02-16: qty 2

## 2012-02-16 MED ORDER — OXYCODONE-ACETAMINOPHEN 5-325 MG PO TABS: 1.0000 | ORAL_TABLET | ORAL | Status: AC | PRN

## 2012-02-16 NOTE — ED Notes (Signed)
To ED for eval of lower back pain. States she has a 'back history' with surgeries.  States she has been moving and thinks she has 'over done it'. Ambulatory into triage without difficulty. Denies difficulty to urinate.

## 2012-02-16 NOTE — ED Provider Notes (Signed)
Medical screening examination/treatment/procedure(s) were performed by non-physician practitioner and as supervising physician I was immediately available for consultation/collaboration.   Gwyneth Sprout, MD 02/16/12 1944

## 2012-02-16 NOTE — ED Provider Notes (Signed)
History     CSN: [id number]  Arrival date & time 02/16/12  1606   First MD Initiated Contact with Patient 02/16/12 1729      Chief Complaint  Patient presents with  . Back Pain    (Consider location/radiation/quality/duration/timing/severity/associated sxs/prior treatment) HPI History provided by pt.   Pt has chronic low back pain and lives at a 34/10 on pain scale.  Had a lumbar fusion in 2008 and discectomy in 2011.  Developed a pinching sensation right low back while twisting her upper torso to place a box on shelf yesterday evening and has had constant pressure in that location ever since.  When she walks, she feels a severely painful rubbing sensation just right of her lumbar spine.  Pain is non-radiating.  No associated fever, abd pain, bladder/bowel dysfunction and lower extremity weakness/parasthesias.  Has taken ibuprofen w/out relief.  No recent trauma, h/o cancer or IV drug abuse.    Past Medical History  Diagnosis Date  . Herniated disc   . Bipolar 1 disorder, manic, moderate     Past Surgical History  Procedure Date  . Back surgery   . Spinal fusion 2008    T5-L1  . Lumbar disc surgery 2011    History reviewed. No pertinent family history.  History  Substance Use Topics  . Smoking status: Current Everyday Smoker -- 0.5 packs/day    Types: Cigarettes  . Smokeless tobacco: Not on file  . Alcohol Use: Yes    OB History    Grav Para Term Preterm Abortions TAB SAB Ect Mult Living                  Review of Systems  All other systems reviewed and are negative.    Allergies  Lamictal  Home Medications   Current Outpatient Rx  Name Route Sig Dispense Refill  . IBUPROFEN 200 MG PO TABS Oral Take 1,200 mg by mouth once.      BP 127/87  Pulse 65  Temp 97.5 F (36.4 C)  Resp 20  SpO2 99%  LMP 01/29/2012  Physical Exam  Nursing note and vitals reviewed. Constitutional: She is oriented to person, place, and time. She appears well-developed and  well-nourished.  HENT:  Head: Normocephalic and atraumatic.  Eyes:       Normal appearance  Neck: Normal range of motion.  Cardiovascular: Normal rate and regular rhythm.   Pulmonary/Chest: Effort normal and breath sounds normal.  Abdominal: Soft. Bowel sounds are normal. She exhibits no distension. There is no tenderness.       obese  Musculoskeletal:       Lumbar spinal and right paraspinal ttp. No CVA ttp. Full ROM of LE but pain w/ passive right hip flexion. Nml patellar reflexes.  No saddle anesthesia. Distal sensation intact.  2+ DP pulses.  Ambulates independently.   Neurological: She is alert and oriented to person, place, and time.  Skin: Skin is warm and dry. No rash noted.  Psychiatric: She has a normal mood and affect. Her behavior is normal.    ED Course  Procedures (including critical care time)  Labs Reviewed - No data to display No results found.   1. Chronic low back pain       MDM  36yo F presents w/ acute on chronic low back pain.  No red flag s/sx and pt is able to ambulate independently.  She has a neurosurgeon that she can f/u with.  Will treat symptomatically.  IM toradol and  dilaudid ordered for pain.  Will reassess shortly.    Pt reports that her pain is back to baseline.  She is inquiring now about occasionally leakage of urine with coughing/sneezing.  Symptoms are most consistent w/ stress incontinence.  Recommended kegels and f/u with Gyn.  D/c'd home w/ percocet.  Return precautions discussed.         Arie [place], [place] 02/16/12 1916

## 2012-02-16 NOTE — Discharge Instructions (Signed)
Take percocet as needed for severe pain.  Do not drive within four hours of taking this medication (may cause drowsiness or confusion).  Follow up with [facility] [phone]; [address]) if you would like to be evaluated for your stress incontinence.  You should return to the ER if you develop fever, leg weakness or loss of control of bladder/bowels.

## 2012-08-24 ENCOUNTER — Emergency Department (HOSPITAL_COMMUNITY)
Admission: EM | Admit: 2012-08-24 | Discharge: 2012-08-24 | Disposition: A | Discharge status: Home / Self Care | Payer: Self-pay | Attending: Emergency Medicine | Admitting: Emergency Medicine

## 2012-08-24 ENCOUNTER — Encounter (HOSPITAL_COMMUNITY): Payer: Self-pay | Admitting: Nurse Practitioner

## 2012-08-24 DIAGNOSIS — M545 Low back pain, unspecified: Secondary | ICD-10-CM | POA: Insufficient documentation

## 2012-08-24 DIAGNOSIS — IMO0002 Reserved for concepts with insufficient information to code with codable children: Secondary | ICD-10-CM | POA: Insufficient documentation

## 2012-08-24 DIAGNOSIS — M549 Dorsalgia, unspecified: Secondary | ICD-10-CM

## 2012-08-24 DIAGNOSIS — F17200 Nicotine dependence, unspecified, uncomplicated: Secondary | ICD-10-CM | POA: Insufficient documentation

## 2012-08-24 DIAGNOSIS — F3012 Manic episode without psychotic symptoms, moderate: Secondary | ICD-10-CM | POA: Insufficient documentation

## 2012-08-24 DIAGNOSIS — G8929 Other chronic pain: Secondary | ICD-10-CM | POA: Insufficient documentation

## 2012-08-24 DIAGNOSIS — Z79899 Other long term (current) drug therapy: Secondary | ICD-10-CM | POA: Insufficient documentation

## 2012-08-24 MED ORDER — HYDROMORPHONE HCL PF 2 MG/ML IJ SOLN
2.0000 mg | Freq: Once | INTRAMUSCULAR | Status: AC
  Administered 2012-08-24: 2 mg via INTRAMUSCULAR
  Filled 2012-08-24: qty 1

## 2012-08-24 MED ORDER — NAPROXEN 500 MG PO TABS: 500.0000 mg | ORAL_TABLET | Freq: Two times a day (BID) | ORAL | Status: DC

## 2012-08-24 MED ORDER — CYCLOBENZAPRINE HCL 10 MG PO TABS: 10.0000 mg | ORAL_TABLET | Freq: Two times a day (BID) | ORAL | Status: DC | PRN

## 2012-08-24 MED ORDER — HYDROCODONE-ACETAMINOPHEN 5-325 MG PO TABS
1.0000 | ORAL_TABLET | Freq: Four times a day (QID) | ORAL | Status: DC | PRN
Start: 2012-08-24 — End: 2013-11-16

## 2012-08-24 NOTE — ED Notes (Signed)
Pt c/o sharp shooting back pain from L hip to L foot onset while playing with her niece today. C/o history of back pain but this is more severe

## 2012-08-24 NOTE — ED Notes (Signed)
Pt reports chronic back pain but was playing with niece and turned and twisted.  Immediate pain 10/10 and throbbing. Radiates down both legs.  Rt leg to foot and left leg to knee.  Pt reports 10/10 pain. Pt has not taken any medications.  Pt alert oriented X4

## 2012-08-24 NOTE — ED Provider Notes (Signed)
History   This chart was scribed for Shelda Jakes, MD by Toya Smothers. The patient was seen in room TR09C/TR09C. Patient's care was started at 1533.  CSN: [id number]  Arrival date & time 08/24/12  1533   First MD Initiated Contact with Patient 08/24/12 1858      Chief Complaint  Patient presents with  . Back Pain   Patient is a 36 y.o. female presenting with back pain. The history is provided by the patient. No language interpreter was used.  Back Pain  This is a new problem. The current episode started 3 to 5 hours ago. The problem occurs constantly. The problem has not changed since onset.Associated with: extension. The pain is present in the lumbar spine. The quality of the pain is described as stabbing. The pain radiates to the left knee. The pain is severe. The symptoms are aggravated by certain positions. She has tried nothing for the symptoms. The treatment provided no relief. Risk factors include poor posture.    Casey Wise is a 36 y.o. female who presents to the Emergency Department complaining of 4 hours of new, sudden onset, constant moderate lower left back pain after extension. Pain is radiating to the lower left leg, aggravated with movement, and alleviated by nothing. She describes a throbbing, sharp, pinching sensation. [name] She denies dysuria, frequency, fever, neck pain, and emesis. Pt lists Hx of chronic back pain as the result of herniated disc.    Past Medical History  Diagnosis Date  . Herniated disc   . Bipolar 1 disorder, manic, moderate     Past Surgical History  Procedure Date  . Back surgery   . Spinal fusion 2008    T5-L1  . Lumbar disc surgery 2011  . Cholecystectomy   . Cesarean section     No family history on file.  History  Substance Use Topics  . Smoking status: Current Every Day Smoker -- 0.5 packs/day    Types: Cigarettes  . Smokeless tobacco: Not on file  . Alcohol Use: No   Review of Systems    Musculoskeletal: Positive for back pain.  All other systems reviewed and are negative.    Allergies  Bee venom and Lamictal  Home Medications   Current Outpatient Rx  Name Route Sig Dispense Refill  . CYCLOBENZAPRINE HCL 10 MG PO TABS Oral Take 1 tablet (10 mg total) by mouth 2 (two) times daily as needed for muscle spasms. 20 tablet 0  . HYDROCODONE-ACETAMINOPHEN 5-325 MG PO TABS Oral Take 1-2 tablets by mouth every 6 (six) hours as needed for pain. 14 tablet 0  . NAPROXEN 500 MG PO TABS Oral Take 1 tablet (500 mg total) by mouth 2 (two) times daily. 14 tablet 0    BP 160/117  Pulse 84  Temp 97.3 F (36.3 C) (Oral)  Resp 20  SpO2 100%  Physical Exam  Constitutional: She is oriented to person, place, and time.  Cardiovascular: Normal rate and regular rhythm.   No murmur heard. Pulmonary/Chest: Effort normal and breath sounds normal. No respiratory distress. She has no wheezes.  Abdominal: Bowel sounds are normal.  Musculoskeletal:       Bilateral lower tenderness, but more on the left.  Neurological: She is alert and oriented to person, place, and time. She has normal reflexes. No cranial nerve deficit. Coordination normal.    ED Course  Procedures  DIAGNOSTIC STUDIES: Oxygen Saturation is 100% on room air, normal by  my interpretation.    COORDINATION OF CARE: 19:15- Evaluated Pt. Pt is awake, alert, and without distress. 19:20- Patient informed of clinical course, understand medical decision-making process, and agree with plan.  Labs Reviewed - No data to display No results found.   1. Back pain   2. Chronic back pain       MDM   Patient with history of chronic back problems. Exacerbation of back with a sudden movement no direct injury patient's complaint now is mostly on the left side of the back. Associated with chronic pain radiating down the right leg with some numbness and now some pain radiating down the left leg to about the level of the knee. No  significant neuro deficits.    I personally performed the services described in this documentation, which was scribed in my presence. The recorded information has been reviewed and considered.    Shelda Jakes, MD 08/24/12 [phone]

## 2013-11-16 ENCOUNTER — Emergency Department (HOSPITAL_COMMUNITY): Payer: Medicaid Other

## 2013-11-16 ENCOUNTER — Emergency Department (HOSPITAL_COMMUNITY)
Admission: EM | Admit: 2013-11-16 | Discharge: 2013-11-16 | Disposition: A | Discharge status: Home / Self Care | Payer: Medicaid Other | Attending: Emergency Medicine | Admitting: Emergency Medicine

## 2013-11-16 ENCOUNTER — Encounter (HOSPITAL_COMMUNITY): Payer: Self-pay | Admitting: Emergency Medicine

## 2013-11-16 DIAGNOSIS — M545 Low back pain, unspecified: Secondary | ICD-10-CM | POA: Insufficient documentation

## 2013-11-16 DIAGNOSIS — R51 Headache: Secondary | ICD-10-CM | POA: Insufficient documentation

## 2013-11-16 DIAGNOSIS — Z888 Allergy status to other drugs, medicaments and biological substances status: Secondary | ICD-10-CM | POA: Insufficient documentation

## 2013-11-16 DIAGNOSIS — M25512 Pain in left shoulder: Secondary | ICD-10-CM

## 2013-11-16 DIAGNOSIS — F3112 Bipolar disorder, current episode manic without psychotic features, moderate: Secondary | ICD-10-CM | POA: Insufficient documentation

## 2013-11-16 DIAGNOSIS — F17200 Nicotine dependence, unspecified, uncomplicated: Secondary | ICD-10-CM | POA: Insufficient documentation

## 2013-11-16 DIAGNOSIS — IMO0002 Reserved for concepts with insufficient information to code with codable children: Secondary | ICD-10-CM | POA: Insufficient documentation

## 2013-11-16 DIAGNOSIS — T07XXXA Unspecified multiple injuries, initial encounter: Secondary | ICD-10-CM | POA: Insufficient documentation

## 2013-11-16 DIAGNOSIS — M549 Dorsalgia, unspecified: Secondary | ICD-10-CM

## 2013-11-16 DIAGNOSIS — M25519 Pain in unspecified shoulder: Secondary | ICD-10-CM | POA: Insufficient documentation

## 2013-11-16 MED ORDER — HYDROCODONE-ACETAMINOPHEN 5-325 MG PO TABS: 1.0000 | ORAL_TABLET | ORAL | Status: DC | PRN

## 2013-11-16 MED ORDER — HYDROCODONE-ACETAMINOPHEN 5-325 MG PO TABS
1.0000 | ORAL_TABLET | Freq: Once | ORAL | Status: AC
Start: 2013-11-16 — End: 2013-11-16
  Administered 2013-11-16: 1 via ORAL
  Filled 2013-11-16: qty 1

## 2013-11-16 NOTE — ED Provider Notes (Signed)
Medical screening examination/treatment/procedure(s) were performed by non-physician practitioner and as supervising physician I was immediately available for consultation/collaboration.  EKG Interpretation   None         Enid SkeensJoshua M Lya Holben, MD 11/16/13 [phone]
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
## 2013-11-16 NOTE — ED Notes (Signed)
Patient with multiple areas of bruising on left upper arm.  Patient with throat pain, area is red, no swelling noted at this time.  Patient has pain all over body.  Patient is slow to move.

## 2013-11-16 NOTE — ED Notes (Signed)
Patient returned from xray.

## 2013-11-16 NOTE — ED Notes (Signed)
Patient placed in gown

## 2013-11-16 NOTE — Discharge Instructions (Signed)
Arthralgia °Your caregiver has diagnosed you as suffering from an arthralgia. Arthralgia means there is pain in a joint. This can come from many reasons including: °· Bruising the joint which causes soreness (inflammation) in the joint. °· Wear and tear on the joints which occur as we grow older (osteoarthritis). °· Overusing the joint. °· Various forms of arthritis. °· Infections of the joint. °Regardless of the cause of pain in your joint, most of these different pains respond to anti-inflammatory drugs and rest. The exception to this is when a joint is infected, and these cases are treated with antibiotics, if it is a bacterial infection. °HOME CARE INSTRUCTIONS  °· Rest the injured area for as long as directed by your caregiver. Then slowly start using the joint as directed by your caregiver and as the pain allows. Crutches as directed may be useful if the ankles, knees or hips are involved. If the knee was splinted or casted, continue use and care as directed. If an stretchy or elastic wrapping bandage has been applied today, it should be removed and re-applied every 3 to 4 hours. It should not be applied tightly, but firmly enough to keep swelling down. Watch toes and feet for swelling, bluish discoloration, coldness, numbness or excessive pain. If any of these problems (symptoms) occur, remove the ace bandage and re-apply more loosely. If these symptoms persist, contact your caregiver or return to this location. °· For the first 24 hours, keep the injured extremity elevated on pillows while lying down. °· Apply ice for 15-20 minutes to the sore joint every couple hours while awake for the first half day. Then 03-04 times per day for the first 48 hours. Put the ice in a plastic bag and place a towel between the bag of ice and your skin. °· Wear any splinting, casting, elastic bandage applications, or slings as instructed. °· Only take over-the-counter or prescription medicines for pain, discomfort, or fever as  directed by your caregiver. Do not use aspirin immediately after the injury unless instructed by your physician. Aspirin can cause increased bleeding and bruising of the tissues. °· If you were given crutches, continue to use them as instructed and do not resume weight bearing on the sore joint until instructed. °Persistent pain and inability to use the sore joint as directed for more than 2 to 3 days are warning signs indicating that you should see a caregiver for a follow-up visit as soon as possible. Initially, a hairline fracture (break in bone) may not be evident on X-rays. Persistent pain and swelling indicate that further evaluation, non-weight bearing or use of the joint (use of crutches or slings as instructed), or further X-rays are indicated. X-rays may sometimes not show a small fracture until a week or 10 days later. Make a follow-up appointment with your own caregiver or one to whom we have referred you. A radiologist (specialist in reading X-rays) may read your X-rays. Make sure you know how you are to obtain your X-ray results. Do not assume everything is normal if you do not hear from us. °SEEK MEDICAL CARE IF: °Bruising, swelling, or pain increases. °SEEK IMMEDIATE MEDICAL CARE IF:  °· Your fingers or toes are numb or blue. °· The pain is not responding to medications and continues to stay the same or get worse. °· The pain in your joint becomes severe. °· You develop a fever over 102° F (38.9° C). °· It becomes impossible to move or use the joint. °MAKE SURE YOU:  °·   Understand these instructions. °· Will watch your condition. °· Will get help right away if you are not doing well or get worse. °Document Released: 10/15/2005 Document Revised: 01/07/2012 Document Reviewed: 06/02/2008 °[employer]® Patient Information ©2014 [employer]. °Back Pain, Adult °Low back pain is very common. About 1 in 5 people have back pain. The cause of low back pain is rarely dangerous. The pain often gets better over  time. About half of people with a sudden onset of back pain feel better in just 2 weeks. About 8 in 10 people feel better by 6 weeks.  °CAUSES °Some common causes of back pain include: °· Strain of the muscles or ligaments supporting the spine. °· Wear and tear (degeneration) of the spinal discs. °· Arthritis. °· Direct injury to the back. °DIAGNOSIS °Most of the time, the direct cause of low back pain is not known. However, back pain can be treated effectively even when the exact cause of the pain is unknown. Answering your caregiver's questions about your overall health and symptoms is one of the most accurate ways to make sure the cause of your pain is not dangerous. If your caregiver needs more information, he or she may order lab work or imaging tests (X-rays or MRIs). However, even if imaging tests show changes in your back, this usually does not require surgery. °HOME CARE INSTRUCTIONS °For many people, back pain returns. Since low back pain is rarely dangerous, it is often a condition that people can learn to manage on their own.  °· Remain active. It is stressful on the back to sit or stand in one place. Do not sit, drive, or stand in one place for more than 30 minutes at a time. Take short walks on level surfaces as soon as pain allows. Try to increase the length of time you walk each day. °· Do not stay in bed. Resting more than 1 or 2 days can delay your recovery. °· Do not avoid exercise or work. Your body is made to move. It is not dangerous to be active, even though your back may hurt. Your back will likely heal faster if you return to being active before your pain is gone. °· Pay attention to your body when you  bend and lift. Many people have less discomfort when lifting if they bend their knees, keep the load close to their bodies, and avoid twisting. Often, the most comfortable positions are those that put less stress on your recovering back. °· Find a comfortable position to sleep. Use a firm  mattress and lie on your side with your knees slightly bent. If you lie on your back, put a pillow under your knees. °· Only take over-the-counter or prescription medicines as directed by your caregiver. Over-the-counter medicines to reduce pain and inflammation are often the most helpful. Your caregiver may prescribe muscle relaxant drugs. These medicines help dull your pain so you can more quickly return to your normal activities and healthy exercise. °· Put ice on the injured area. °· Put ice in a plastic bag. °· Place a towel between your skin and the bag. °· Leave the ice on for 15-20 minutes, 03-04 times a day for the first 2 to 3 days. After that, ice and heat may be alternated to reduce pain and spasms. °· Ask your caregiver about trying back exercises and gentle massage. This may be of some benefit. °· Avoid feeling anxious or stressed. Stress increases muscle tension and can worsen back pain. It is important to recognize when you are anxious or stressed and learn ways to manage it. Exercise is a great option. °SEEK   MEDICAL CARE IF: °· You have pain that is not relieved with rest or medicine. °· You have pain that does not improve in 1 week. °· You have new symptoms. °· You are generally not feeling well. °SEEK IMMEDIATE MEDICAL CARE IF:  °· You have pain that radiates from your back into your legs. °· You develop new bowel or bladder control problems. °· You have unusual weakness or numbness in your arms or legs. °· You develop nausea or vomiting. °· You develop abdominal pain. °· You feel faint. °Document Released: 10/15/2005 Document Revised: 04/15/2012 Document Reviewed: 03/05/2011 °[employer]® Patient Information ©2014 [employer]. ° °

## 2013-11-16 NOTE — ED Notes (Signed)
Patient transported to X-ray 
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
## 2014-04-19 ENCOUNTER — Encounter (HOSPITAL_COMMUNITY): Payer: Self-pay | Admitting: Emergency Medicine

## 2014-04-19 ENCOUNTER — Emergency Department (HOSPITAL_COMMUNITY)
Admission: EM | Admit: 2014-04-19 | Discharge: 2014-04-19 | Disposition: A | Discharge status: Home / Self Care | Payer: Medicaid Other | Attending: Emergency Medicine | Admitting: Emergency Medicine

## 2014-04-19 DIAGNOSIS — M549 Dorsalgia, unspecified: Secondary | ICD-10-CM | POA: Insufficient documentation

## 2014-04-19 DIAGNOSIS — Z8659 Personal history of other mental and behavioral disorders: Secondary | ICD-10-CM | POA: Insufficient documentation

## 2014-04-19 DIAGNOSIS — R52 Pain, unspecified: Secondary | ICD-10-CM

## 2014-04-19 DIAGNOSIS — M2550 Pain in unspecified joint: Secondary | ICD-10-CM

## 2014-04-19 DIAGNOSIS — Z9889 Other specified postprocedural states: Secondary | ICD-10-CM | POA: Insufficient documentation

## 2014-04-19 DIAGNOSIS — F17200 Nicotine dependence, unspecified, uncomplicated: Secondary | ICD-10-CM | POA: Insufficient documentation

## 2014-04-19 DIAGNOSIS — M25519 Pain in unspecified shoulder: Secondary | ICD-10-CM | POA: Insufficient documentation

## 2014-04-19 DIAGNOSIS — M25569 Pain in unspecified knee: Secondary | ICD-10-CM | POA: Insufficient documentation

## 2014-04-19 DIAGNOSIS — G8929 Other chronic pain: Secondary | ICD-10-CM | POA: Insufficient documentation

## 2014-04-19 LAB — URINE MICROSCOPIC-ADD ON

## 2014-04-19 LAB — URINALYSIS, ROUTINE W REFLEX MICROSCOPIC
Glucose, UA: NEGATIVE mg/dL
Hgb urine dipstick: NEGATIVE
KETONES UR: 15 mg/dL — AB
Nitrite: NEGATIVE
PROTEIN: NEGATIVE mg/dL
Specific Gravity, Urine: 1.029 (ref 1.005–1.030)
UROBILINOGEN UA: 0.2 mg/dL (ref 0.0–1.0)
pH: 6 (ref 5.0–8.0)

## 2014-04-19 MED ORDER — KETOROLAC TROMETHAMINE 60 MG/2ML IM SOLN
60.0000 mg | Freq: Once | INTRAMUSCULAR | Status: AC
  Administered 2014-04-19: 60 mg via INTRAMUSCULAR
  Filled 2014-04-19: qty 2

## 2014-04-19 MED ORDER — HYDROCODONE-ACETAMINOPHEN 5-325 MG PO TABS
2.0000 | ORAL_TABLET | Freq: Once | ORAL | Status: AC
  Administered 2014-04-19: 2 via ORAL
  Filled 2014-04-19: qty 2

## 2014-04-19 MED ORDER — TRAMADOL HCL 50 MG PO TABS: 50.0000 mg | ORAL_TABLET | Freq: Four times a day (QID) | ORAL | Status: AC | PRN

## 2014-04-19 MED ORDER — MORPHINE SULFATE 4 MG/ML IJ SOLN
6.0000 mg | INTRAMUSCULAR | Status: DC | PRN
  Administered 2014-04-19: 6 mg via INTRAMUSCULAR
  Filled 2014-04-19: qty 2

## 2014-04-19 MED ORDER — NAPROXEN 375 MG PO TABS: 375.0000 mg | ORAL_TABLET | Freq: Two times a day (BID) | ORAL | Status: AC

## 2014-04-19 NOTE — ED Provider Notes (Signed)
CSN: [id number]     Arrival date & time 04/19/14  0148 History   First MD Initiated Contact with Patient 04/19/14 [phone]     Chief Complaint  Patient presents with  . body aches      (Consider location/radiation/quality/duration/timing/severity/associated sxs/prior Treatment) HPI Comments: 38 year old female with history of sciatica and chronic pain presents with worsening pain. Patient has a history of chronic pain worse and bilateral hips and knee however for the past 3 days patient's had right knee pain, left buttock/upper hip and right shoulder pain. No fevers or chills. No history of known arthritis. Patient has had numerous back surgeries but denies back pain or leg weakness. No new rashes. No urinary symptoms. Patient recently was approved for Medicaid and will obtain a primary Dr. seen. Patient has taken ibuprofen and Tylenol for pain with minimal improvement. Pain fairly constant and worse with palpation range of motion.  The history is provided by the patient.    Past Medical History  Diagnosis Date  . Herniated disc   . Bipolar 1 disorder, manic, moderate    Past Surgical History  Procedure Laterality Date  . Back surgery    . Spinal fusion  2008    T5-L1  . Lumbar disc surgery  2011  . Cholecystectomy    . Cesarean section     No family history on file. History  Substance Use Topics  . Smoking status: Current Every Day Smoker -- 0.50 packs/day    Types: Cigarettes  . Smokeless tobacco: Not on file  . Alcohol Use: No   OB History   Grav Para Term Preterm Abortions TAB SAB Ect Mult Living                 Review of Systems  Constitutional: Negative for fever and chills.  Respiratory: Negative for shortness of breath.   Cardiovascular: Negative for chest pain.  Gastrointestinal: Negative for vomiting and abdominal pain.  Genitourinary: Negative for dysuria and flank pain.  Musculoskeletal: Positive for arthralgias and back pain (chronic). Negative for neck pain  and neck stiffness.  Skin: Negative for rash.  Neurological: Negative for weakness, light-headedness, numbness and headaches.      Allergies  Bee venom and Lamictal  Home Medications   Prior to Admission medications   Medication Sig Start Date End Date Taking? Authorizing Provider  acetaminophen (TYLENOL) 500 MG tablet Take 1,000 mg by mouth every 6 (six) hours as needed for moderate pain.   Yes Historical Provider, MD  naproxen sodium (ANAPROX) 220 MG tablet Take 220 mg by mouth 3 (three) times daily as needed (for pain).   Yes Historical Provider, MD   BP 144/90  Pulse 67  Temp(Src) 97.9 F (36.6 C) (Oral)  Resp 24  Ht 5\' 7"  (1.702 m)  Wt 286 lb 4 oz (129.842 kg)  BMI 44.82 kg/m2  SpO2 96%  LMP 04/16/2014 Physical Exam  Nursing note and vitals reviewed. Constitutional: She is oriented to person, place, and time. She appears well-developed and well-nourished.  HENT:  Head: Normocephalic and atraumatic.  Eyes: Conjunctivae are normal. Right eye exhibits no discharge. Left eye exhibits no discharge.  Neck: Normal range of motion. Neck supple. No tracheal deviation present.  Cardiovascular: Normal rate and regular rhythm.   Pulmonary/Chest: Effort normal and breath sounds normal.  Abdominal: Soft. She exhibits no distension. There is no tenderness (obese). There is no guarding.  Musculoskeletal: She exhibits tenderness. She exhibits no edema.  Patient has tenderness anterior and lateral right  knee with full range of motion and no signs of infection or significant swelling. Gross ligaments intact. Patient has mild tenderness left posterior iliac crest, no sign of infection. Patient has tenderness right anterior lateral shoulder worse with all range of motion. No edema warmth or sign of infection.  Neurological: She is alert and oriented to person, place, and time.  Skin: Skin is warm. No rash noted.  Psychiatric: She has a normal mood and affect.    ED Course  Procedures  (including critical care time) Labs Review Labs Reviewed  URINALYSIS, ROUTINE W REFLEX MICROSCOPIC - Abnormal; Notable for the following:    Color, Urine AMBER (*)    APPearance CLOUDY (*)    Bilirubin Urine SMALL (*)    Ketones, ur 15 (*)    Leukocytes, UA SMALL (*)    All other components within normal limits  URINE MICROSCOPIC-ADD ON - Abnormal; Notable for the following:    Squamous Epithelial / LPF FEW (*)    All other components within normal limits    Imaging Review No results found.   EKG Interpretation None      MDM   Final diagnoses:  Body aches  Joint pain   Acute on chronic pain, patient is currently arranging for primary Dr. Outpatient. Plan for pain medicines in ER and followup as discussed, vitals reviewed unremarkable, no fevers or signs of septic joint. Discussed differential acute on chronic pain versus fibromyalgia vs new arthritis versus other.  Results and differential diagnosis were discussed with the patient/parent/guardian. Close follow up outpatient was discussed, comfortable with the plan.   Medications  ketorolac (TORADOL) injection 60 mg (not administered)  HYDROcodone-acetaminophen (NORCO/VICODIN) 5-325 MG per tablet 2 tablet (not administered)    Filed Vitals:   04/19/14 0156  BP: 144/90  Pulse: 67  Temp: 97.9 F (36.6 C)  TempSrc: Oral  Resp: 24  Height: 5\' 7"  (1.702 m)  Weight: 286 lb 4 oz (129.842 kg)  SpO2: 96%       Enid SkeensJoshua M Zavitz, MD 04/19/14 [phone]

## 2014-04-19 NOTE — Discharge Instructions (Signed)
If you were given medicines take as directed.  If you are on coumadin or contraceptives realize their levels and effectiveness is altered by many different medicines.  If you have any reaction (rash, tongues swelling, other) to the medicines stop taking and see a physician.   Please follow up as directed and return to the ER or see a physician for new or worsening symptoms.  Thank you. Filed Vitals:   04/19/14 0156 04/19/14 0330 04/19/14 0400 04/19/14 0508  BP: 144/90 125/55 113/71 150/96  Pulse: 67 56 60 58  Temp: 97.9 F (36.6 C)     TempSrc: Oral     Resp: 24   12  Height: 5\' 7"  (1.702 m)     Weight: 286 lb 4 oz (129.842 kg)     SpO2: 96% 97% 96% 97%

## 2014-04-19 NOTE — ED Notes (Signed)
The pt has had body aches for 3 days particularly rt knee  Lt buttocks and both shoulders. No temp.  .  She has chronic pain and she has d numerus  Back surgeries.  lmp 3 days ago
